# Patient Record
Sex: Female | Born: 1935 | Race: Black or African American | Hispanic: No | Smoking: Never smoker
Health system: Southern US, Community
[De-identification: ages and names within clinical notes are randomized; demographics above are authoritative.]

## PROBLEM LIST (undated history)

## (undated) DIAGNOSIS — I10 Essential (primary) hypertension: Secondary | ICD-10-CM

## (undated) DIAGNOSIS — C50919 Malignant neoplasm of unspecified site of unspecified female breast: Secondary | ICD-10-CM

## (undated) DIAGNOSIS — E119 Type 2 diabetes mellitus without complications: Secondary | ICD-10-CM

## (undated) HISTORY — PX: ABDOMINAL HYSTERECTOMY: SHX81

## (undated) HISTORY — PX: OTHER SURGICAL HISTORY: SHX169

## (undated) HISTORY — PX: BREAST SURGERY: SHX581

## (undated) HISTORY — PX: BLADDER SURGERY: SHX569

---

## 2012-09-22 ENCOUNTER — Emergency Department (HOSPITAL_BASED_OUTPATIENT_CLINIC_OR_DEPARTMENT_OTHER): Payer: Medicare Other

## 2012-09-22 ENCOUNTER — Encounter (HOSPITAL_BASED_OUTPATIENT_CLINIC_OR_DEPARTMENT_OTHER): Payer: Self-pay | Admitting: *Deleted

## 2012-09-22 ENCOUNTER — Emergency Department (HOSPITAL_BASED_OUTPATIENT_CLINIC_OR_DEPARTMENT_OTHER)
Admission: EM | Admit: 2012-09-22 | Discharge: 2012-09-23 | Disposition: A | Discharge status: Other Acute Inpatient Hospital | Payer: Medicare Other | Attending: Emergency Medicine | Admitting: Emergency Medicine

## 2012-09-22 DIAGNOSIS — R059 Cough, unspecified: Secondary | ICD-10-CM | POA: Insufficient documentation

## 2012-09-22 DIAGNOSIS — R0602 Shortness of breath: Secondary | ICD-10-CM | POA: Insufficient documentation

## 2012-09-22 DIAGNOSIS — M549 Dorsalgia, unspecified: Secondary | ICD-10-CM | POA: Insufficient documentation

## 2012-09-22 DIAGNOSIS — E119 Type 2 diabetes mellitus without complications: Secondary | ICD-10-CM | POA: Insufficient documentation

## 2012-09-22 DIAGNOSIS — I10 Essential (primary) hypertension: Secondary | ICD-10-CM | POA: Insufficient documentation

## 2012-09-22 DIAGNOSIS — R079 Chest pain, unspecified: Secondary | ICD-10-CM

## 2012-09-22 DIAGNOSIS — Z853 Personal history of malignant neoplasm of breast: Secondary | ICD-10-CM | POA: Insufficient documentation

## 2012-09-22 DIAGNOSIS — Z79899 Other long term (current) drug therapy: Secondary | ICD-10-CM | POA: Insufficient documentation

## 2012-09-22 DIAGNOSIS — R05 Cough: Secondary | ICD-10-CM | POA: Insufficient documentation

## 2012-09-22 HISTORY — DX: Malignant neoplasm of unspecified site of unspecified female breast: C50.919

## 2012-09-22 HISTORY — DX: Type 2 diabetes mellitus without complications: E11.9

## 2012-09-22 HISTORY — DX: Essential (primary) hypertension: I10

## 2012-09-22 LAB — COMPREHENSIVE METABOLIC PANEL
AST: 13 U/L (ref 0–37)
Albumin: 3.2 g/dL — ABNORMAL LOW (ref 3.5–5.2)
Calcium: 9.4 mg/dL (ref 8.4–10.5)
Creatinine, Ser: 1.2 mg/dL — ABNORMAL HIGH (ref 0.50–1.10)
GFR calc non Af Amer: 43 mL/min — ABNORMAL LOW (ref >90)

## 2012-09-22 LAB — CBC WITH DIFFERENTIAL/PLATELET
Basophils Absolute: 0.1 10*3/uL (ref 0.0–0.1)
Basophils Relative: 1 % (ref 0–1)
Eosinophils Absolute: 0.4 10*3/uL (ref 0.0–0.7)
Eosinophils Relative: 5 % (ref 0–5)
HCT: 36.1 % (ref 36.0–46.0)
MCHC: 32.7 g/dL (ref 30.0–36.0)
MCV: 87.2 fL (ref 78.0–100.0)
Monocytes Absolute: 0.8 10*3/uL (ref 0.1–1.0)
RDW: 15.6 % — ABNORMAL HIGH (ref 11.5–15.5)

## 2012-09-22 LAB — TROPONIN I: Troponin I: <0.3 ng/mL (ref <0.30)

## 2012-09-22 LAB — D-DIMER, QUANTITATIVE: D-Dimer, Quant: 1.55 ug/mL-FEU — ABNORMAL HIGH (ref 0.00–0.48)

## 2012-09-22 MED ORDER — IOHEXOL 350 MG/ML SOLN
100.0000 mL | Freq: Once | INTRAVENOUS | Status: AC | PRN
  Administered 2012-09-22: 100 mL via INTRAVENOUS

## 2012-09-22 NOTE — ED Provider Notes (Addendum)
History     CSN: [id number]  Arrival date & time 09/22/12  9562   First MD Initiated Contact with Patient 09/22/12 1938      Chief Complaint  Patient presents with  . Chest Pain    (Consider location/radiation/quality/duration/timing/severity/associated sxs/prior treatment) HPI Comments: Patient presents with intermittent back pain between her shoulder blades it has been happening on and off for the past week. It radiates to her central chest and is associated with shortness of breath. She denies any cough, nausea or vomiting. She was seen at [facility] 4 days ago for the same symptoms. She is unable to say what tests were done. She is scheduled for a stress test on February 15. She denies any history of cardiac problems. Her cough is productive of clear mucus. She denies any fevers, leg pain or swelling. She has a history of diabetes, breast cancer, hypertension.  The history is provided by the patient.    Past Medical History  Diagnosis Date  . Hypertension   . Diabetes mellitus without complication   . Breast cancer     Past Surgical History  Procedure Date  . Abdominal hysterectomy   . Breast surgery   . Bladder surgery   . Knee replacement     No family history on file.  History  Substance Use Topics  . Smoking status: Never Smoker   . Smokeless tobacco: Not on file  . Alcohol Use: No    OB History    Grav Para Term Preterm Abortions TAB SAB Ect Mult Living                  Review of Systems  Constitutional: Negative for fever, activity change and appetite change.  HENT: Negative for congestion and rhinorrhea.   Respiratory: Positive for chest tightness and shortness of breath. Negative for cough.   Cardiovascular: Positive for chest pain.  Gastrointestinal: Negative for nausea, vomiting and abdominal pain.  Genitourinary: Negative for dysuria, hematuria, vaginal bleeding and vaginal discharge.  Musculoskeletal: Positive for back pain.   Skin: Negative for rash.  A complete 10 system review of systems was obtained and all systems are negative except as noted in the HPI and PMH.    Allergies  Review of patient's allergies indicates no known allergies.  Home Medications   Current Outpatient Rx  Name  Route  Sig  Dispense  Refill  . AMLODIPINE BESYLATE 5 MG PO TABS   Oral   Take 5 mg by mouth daily.         Marland Kitchen GLIPIZIDE 10 MG PO TABS   Oral   Take 10 mg by mouth daily.         Marland Kitchen HYDROCHLOROTHIAZIDE 25 MG PO TABS   Oral   Take 25 mg by mouth daily.         Marland Kitchen RAMIPRIL 10 MG PO CAPS   Oral   Take 10 mg by mouth daily.           BP 147/77  Pulse 65  Temp 98.5 F (36.9 C) (Oral)  Resp 20  Ht 5\' 2"  (1.575 m)  Wt 245 lb (111.131 kg)  BMI 44.81 kg/m2  SpO2 99%  Physical Exam  Constitutional: She is oriented to person, place, and time. She appears well-developed and well-nourished. No distress.  HENT:  Head: Normocephalic and atraumatic.  Mouth/Throat: Oropharynx is clear and moist. No oropharyngeal exudate.  Eyes: Conjunctivae normal and EOM are normal. Pupils are equal, round, and  reactive to light.  Neck: Normal range of motion. Neck supple.  Cardiovascular: Normal rate, regular rhythm and normal heart sounds.   No murmur heard. Pulmonary/Chest: Effort normal and breath sounds normal. No respiratory distress.  Abdominal: Soft. There is no tenderness. There is no rebound and no guarding.  Musculoskeletal: She exhibits edema.       Left arm lymphedema Tender to palpation in the upper thoracic spine  Neurological: She is alert and oriented to person, place, and time. No cranial nerve deficit. She exhibits normal muscle tone. Coordination normal.       Equal grip strength bilaterally.  Skin: Skin is warm.    ED Course  Procedures (including critical care time)  Labs Reviewed  CBC WITH DIFFERENTIAL - Abnormal; Notable for the following:    Hemoglobin 11.8 (*)     RDW 15.6 (*)     All other  components within normal limits  COMPREHENSIVE METABOLIC PANEL - Abnormal; Notable for the following:    BUN 24 (*)     Creatinine, Ser 1.20 (*)     Total Protein 8.4 (*)     Albumin 3.2 (*)     Total Bilirubin 0.2 (*)     GFR calc non Af Amer 43 (*)     GFR calc Af Amer 50 (*)     All other components within normal limits  D-DIMER, QUANTITATIVE - Abnormal; Notable for the following:    D-Dimer, Quant 1.55 (*)     All other components within normal limits  TROPONIN I  TROPONIN I   Dg Chest 2 View  09/22/2012  *RADIOLOGY REPORT*  Clinical Data: Chest pain and shortness of breath.  CHEST - 2 VIEW  Comparison: None.  Findings: There is mild cardiomegaly without pulmonary edema. Lungs are clear.  No pneumothorax or pleural effusion.  The aorta is ectatic.  Degenerative change is present about the shoulders.  IMPRESSION: Cardiomegaly without acute disease.   Original Report Authenticated By: Holley Dexter, M.D.      No diagnosis found.    MDM  One week of intermittent mid back pain it radiates to the chest that is squeezing associated with shortness of breath. Pain free at this time. Recent admission to [facility] for the same.   EKG shows normal sinus rhythm. Troponin negative. Patient is pain-free at this time. Concern for anginal equivalent and patient with diabetes, hypertension, hyperlipidemia, obesity. Patient has not had any cardiac testing 15 years. She was hospitalized at [facility] 2 days ago with similar symptoms and discharged prior to stress test.  Records obtained from [facility] showed patient was admitted that were thoroughly with similar symptoms. She had a negative CT angiogram of her chest. She had negative serial troponins.  Given patient's risk factors is concerned that she has angina. Discussed with Dr. Selena Batten who agrees and will accept patient back to [place].   Date: 09/22/2012  Rate: 66  Rhythm: normal sinus rhythm  QRS Axis: normal  Intervals: PR prolonged  ST/T  Wave abnormalities: normal  Conduction Disutrbances:none  Narrative Interpretation:   Old EKG Reviewed: none available    Glynn Octave, MD 09/22/12 2331  Addendum: late entry.  After discussion with Dr. Selena Batten, it was found [facility] has no telemetry beds available.  Offered patient admission to [facility] which she refuses and wishes to wait for bed at [facility].  Glynn Octave, MD 09/23/12 1332

## 2012-09-22 NOTE — ED Notes (Signed)
Pt sts she has been sick x2 weeks and 2-3 days ago she began having an intermittent squeezing pain in her middle back radiating through to her center chest. Pt sts some SOB but no n/v.

## 2012-09-23 NOTE — ED Notes (Signed)
Report received from Scott Bennett, RN, care assumed.  

## 2012-09-23 NOTE — ED Notes (Signed)
Still awaiting bed at [facility].  Spoke with Doy Hutching, nursing supervisor, states he will call as soon as a bed is available.

## 2012-09-23 NOTE — ED Notes (Signed)
House supervisor at [facility] informed of pt in ED and stated they are waiting for discharges

## 2012-09-23 NOTE — ED Notes (Addendum)
Room 705 assigned at [facility], bed not ready.  [facility] will transport pt. Ptl updated with plan of care.

## 2012-09-23 NOTE — ED Notes (Signed)
Awaiting admission bed from [facility].  Pt informed of plan of care.

## 2014-11-06 IMAGING — CR DG CHEST 2V
2 series · 2 of 2 positions shown · non-contrast
Comparison: None.

CLINICAL DATA: Chest pain and shortness of breath.

CHEST - 2 VIEW

[w chest pa]
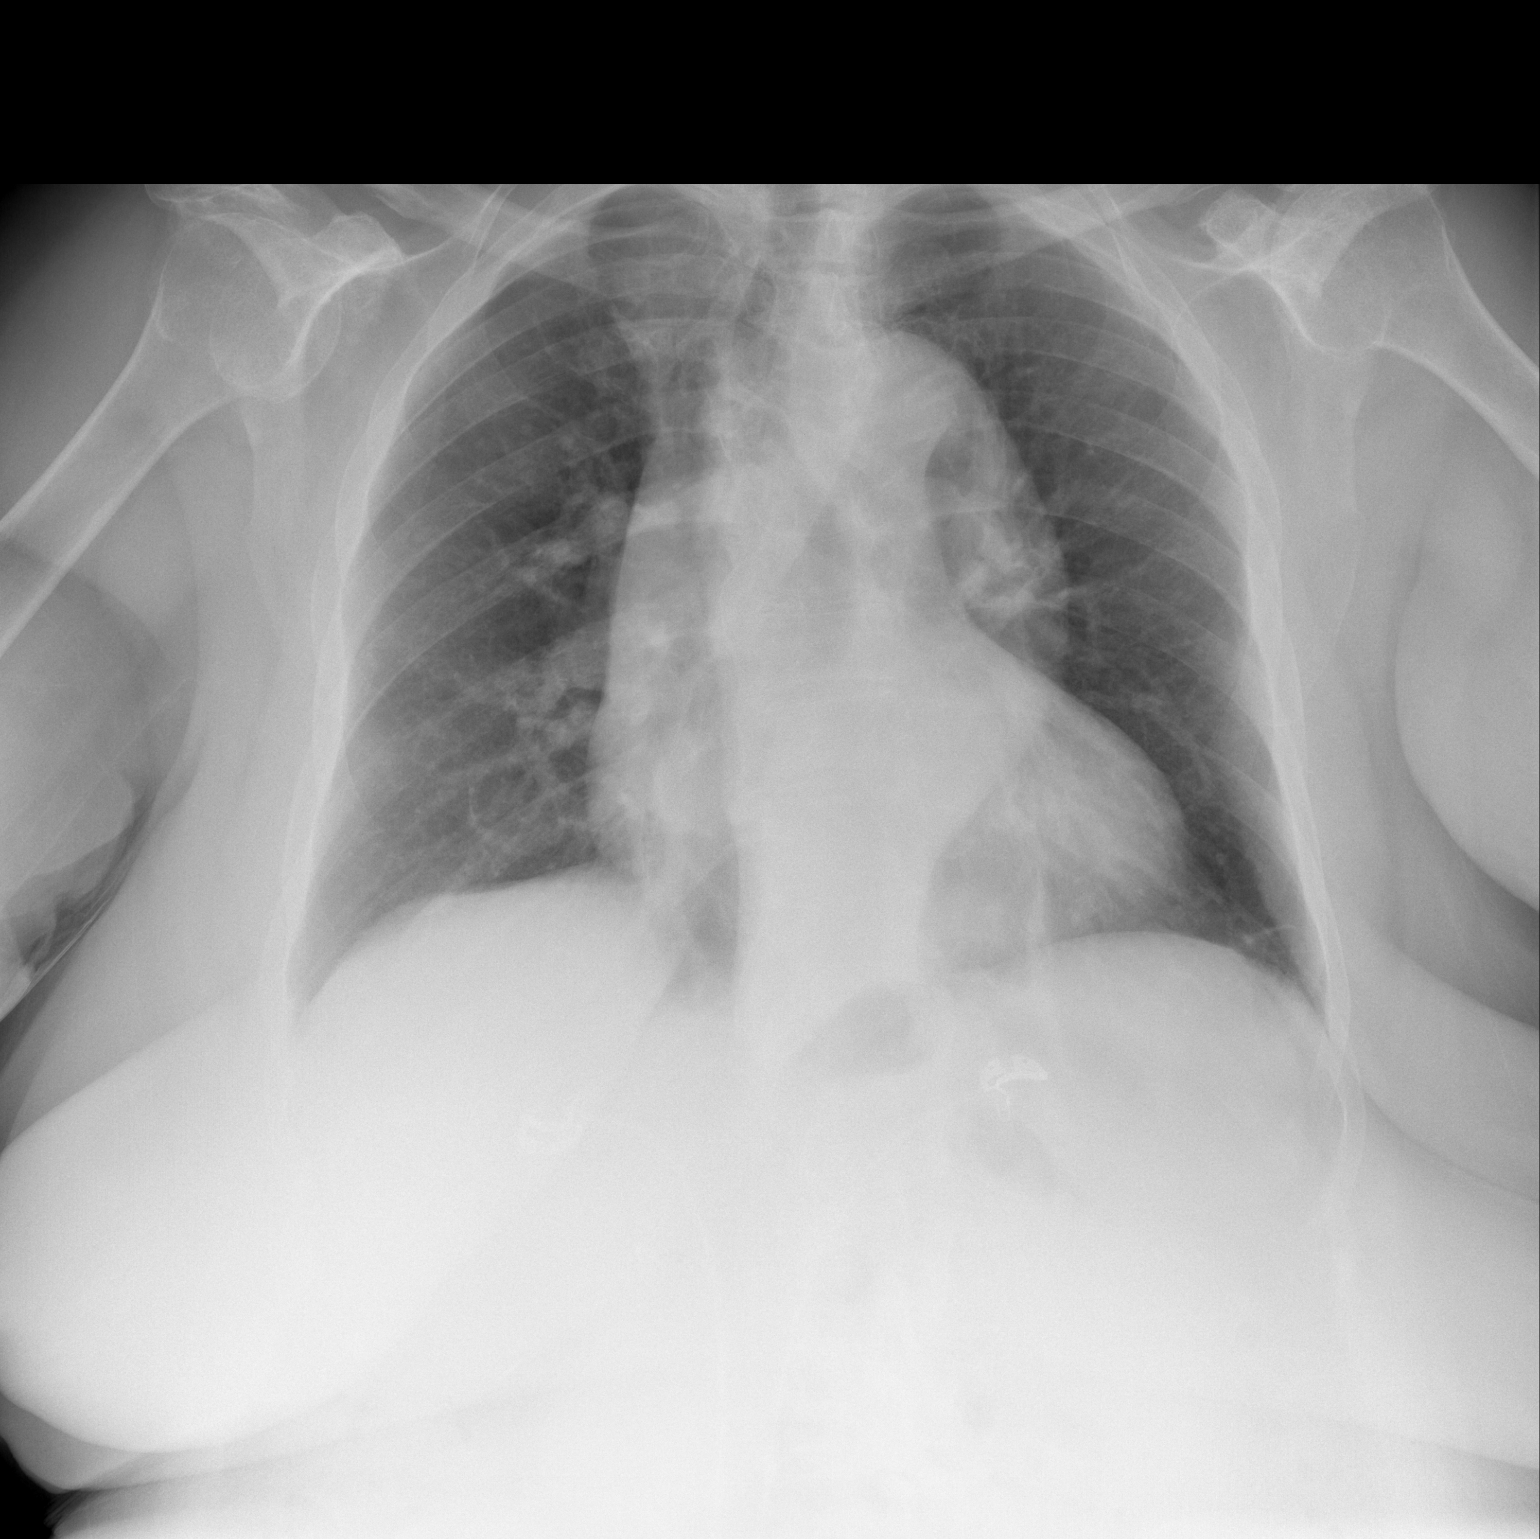

[w chest lat]
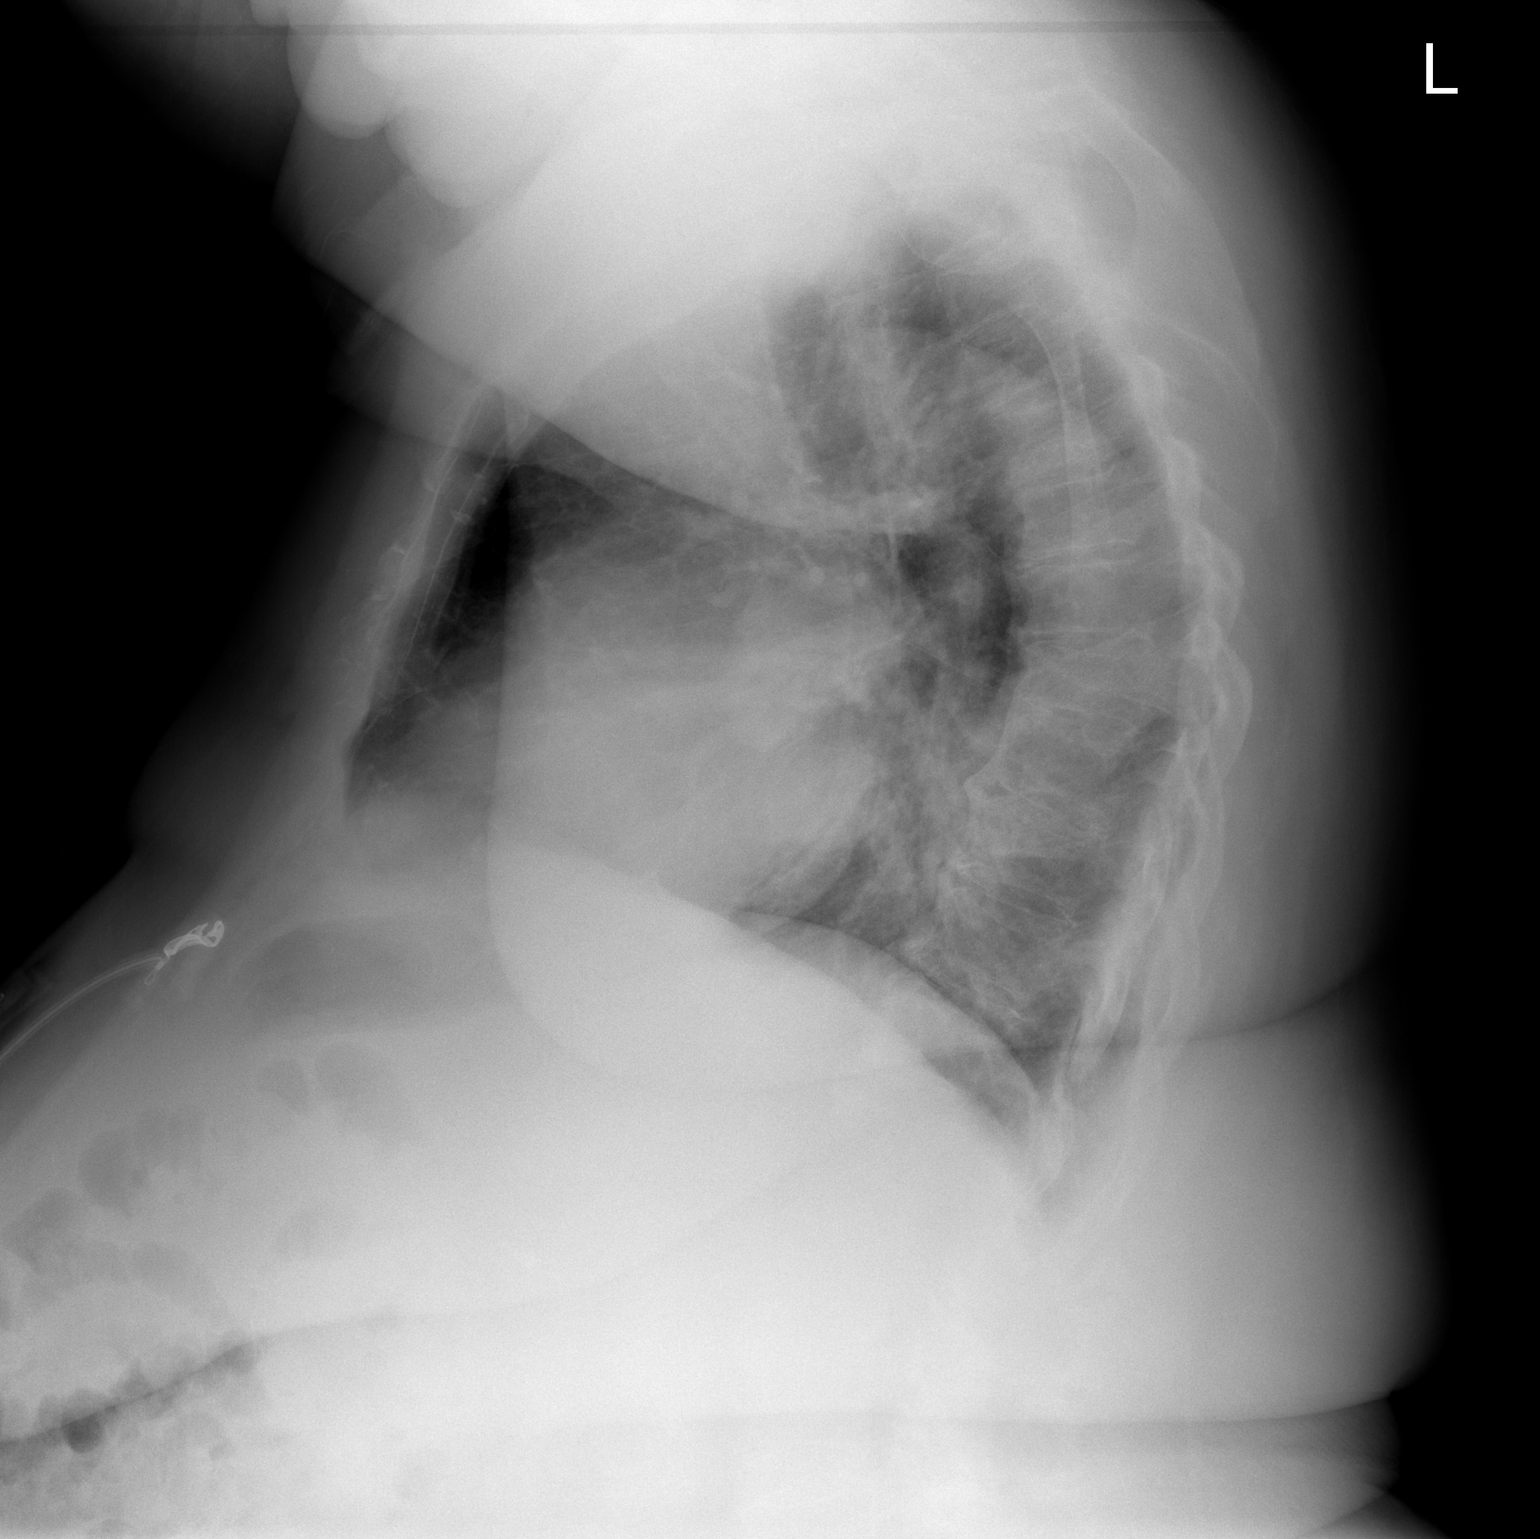

[2 of 2 positions shown; findings below may reference images not displayed]

FINDINGS: There is mild cardiomegaly without pulmonary edema.
Lungs are clear.  No pneumothorax or pleural effusion.  The aorta
is ectatic.  Degenerative change is present about the shoulders.
IMPRESSION: Cardiomegaly without acute disease.

## 2014-11-06 IMAGING — CT CT ANGIO CHEST
2 of 7 series · 17 of 46 positions shown · IV contrast (APPLIED)
Comparison: None.

CTA CHEST

CLINICAL DATA: MID CHEST PAIN AND BACK PAIN.  ELEVATED D-DIMER.
SHORTNESS BREATH.  DIABETIC HYPERTENSIVE PATIENT.  BREAST CANCER.
QUESTION DISSECTION

CT ANGIOGRAPHY CHEST, ABDOMEN AND PELVIS
TECHNIQUE: Multidetector CT imaging through the chest, abdomen and
pelvis was performed using the standard protocol during bolus
administration of intravenous contrast.  Multiplanar reconstructed
images including MIPs were obtained and reviewed to evaluate the
vascular anatomy.
Contrast: 100mL OMNIPAQUE IOHEXOL 350 MG/ML SOLN,

[Series 4: dissection 2.0 b26f · axial · 0.86mm/px · z∈[-418,+54]mm · 14 of 264 slices shown]
[im 14/264  lung]
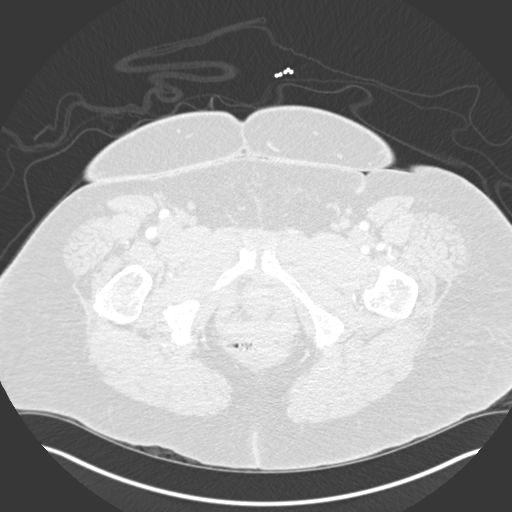
[im 28/264  soft-tissue]
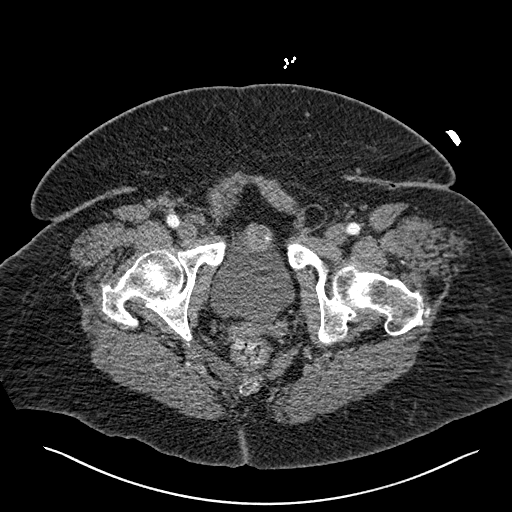
[im 56/264  lung]
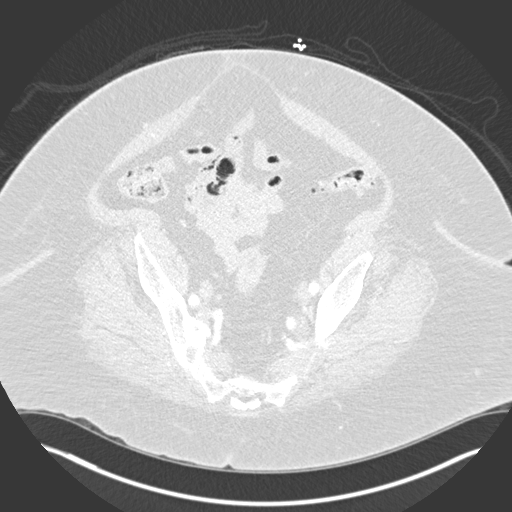
[im 70/264  soft-tissue]
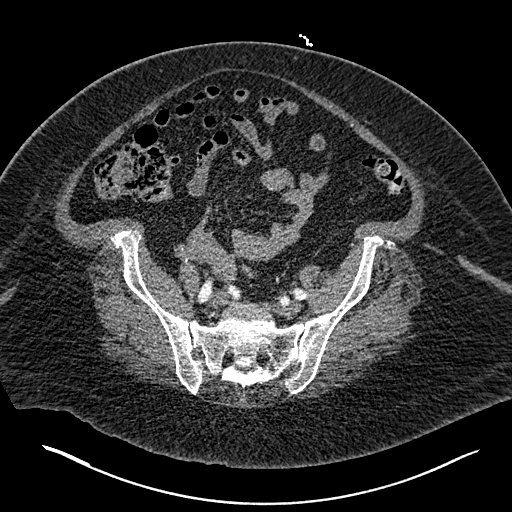
[im 84/264  lung]
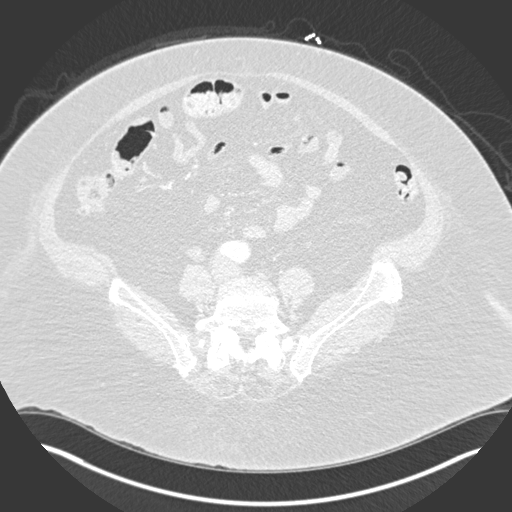
[im 111/264  soft-tissue]
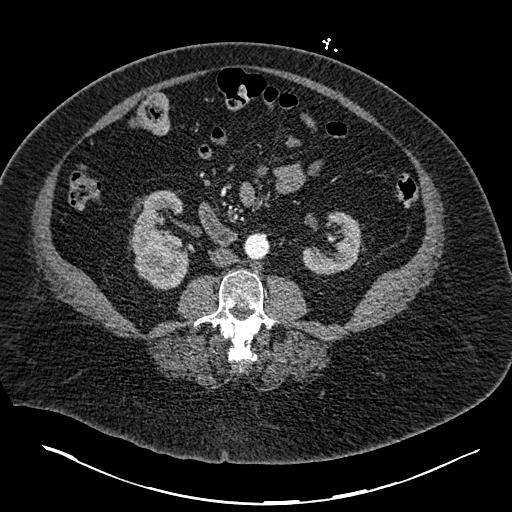
[im 125/264  lung]
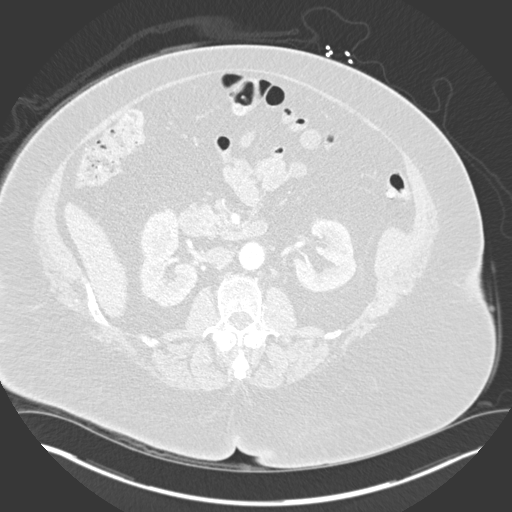
[im 139/264  soft-tissue]
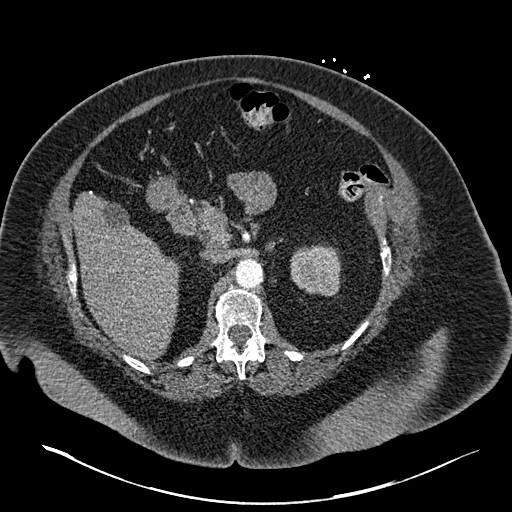
[im 153/264  lung]
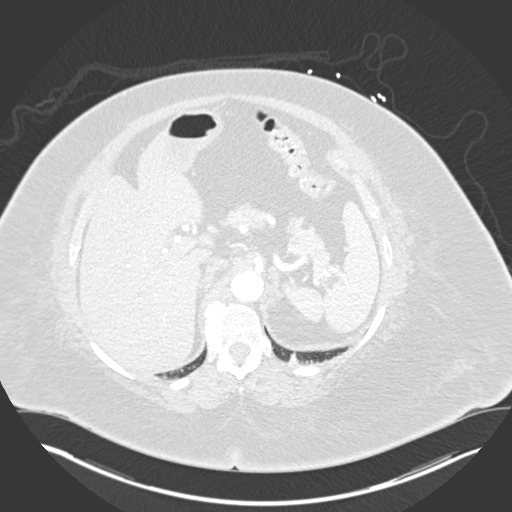
[im 180/264  soft-tissue]
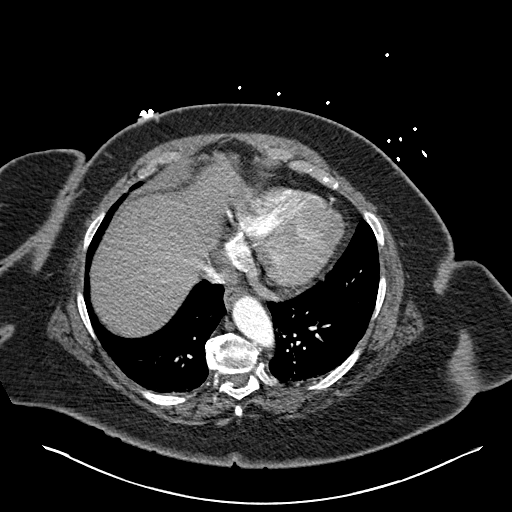
[im 194/264  lung]
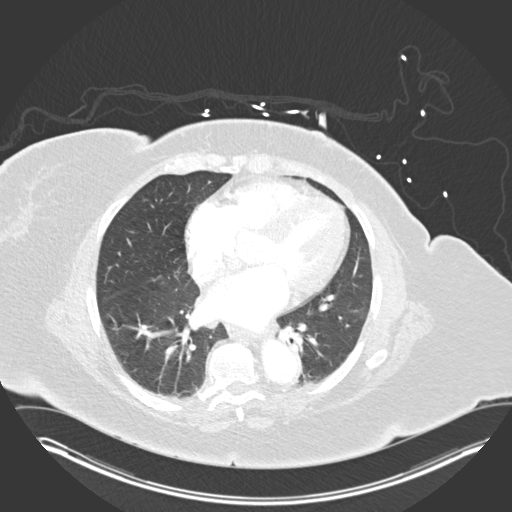
[im 208/264  soft-tissue]
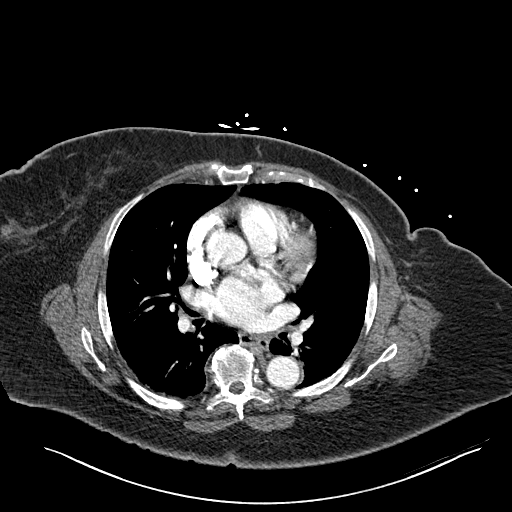
[im 236/264  lung]
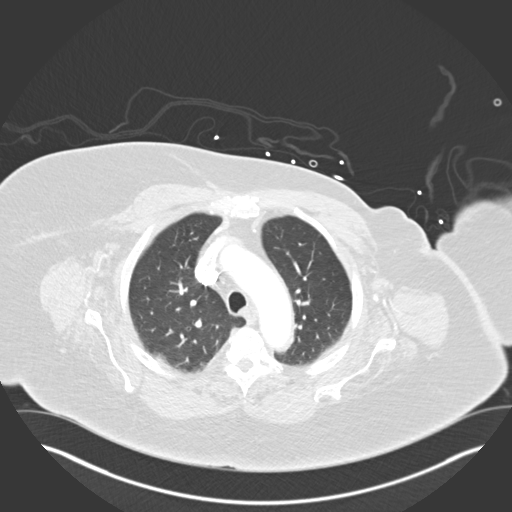
[im 250/264  soft-tissue]
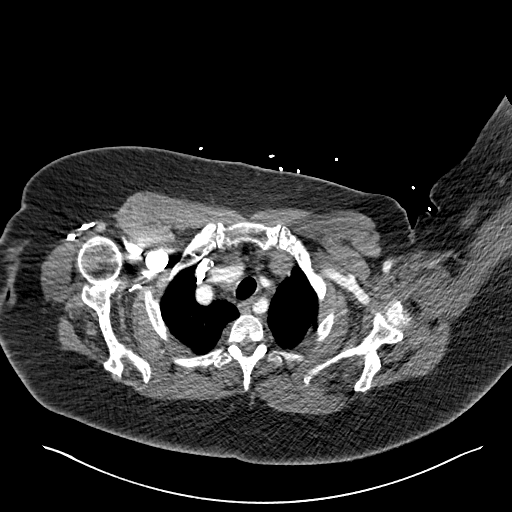

[Series 7: dissection 2.0 coronal · coronal · 1.06mm/px · 3 of 175 slices shown]
[im 44/175  soft-tissue]
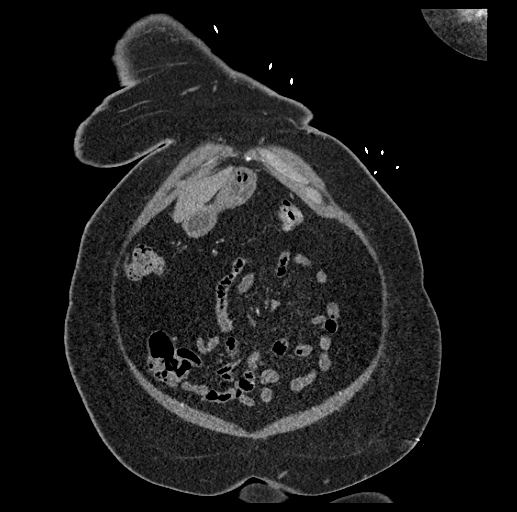
[im 88/175  soft-tissue]
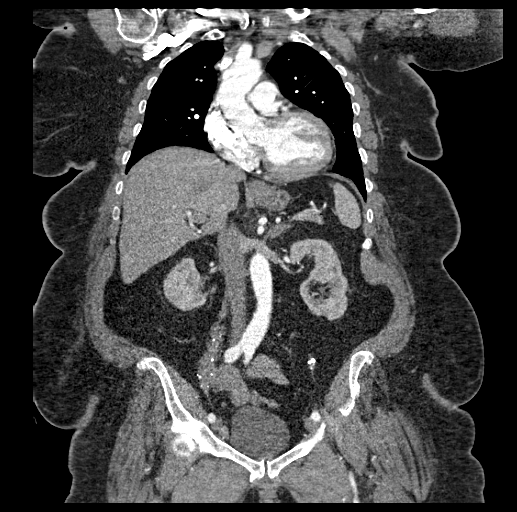
[im 131/175  soft-tissue]
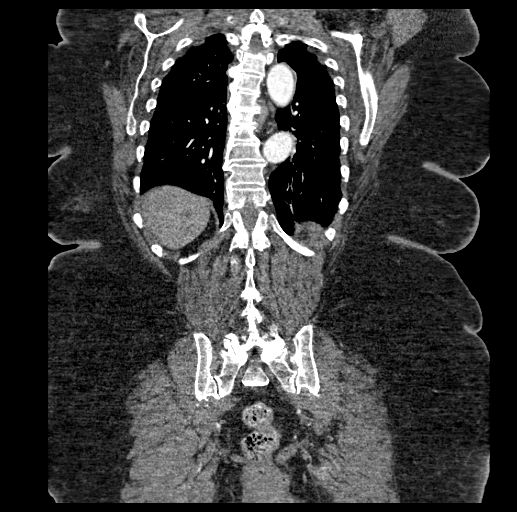

[17 of 46 positions shown; findings below may reference images not displayed]

FINDINGS: Mild cardiac motion slightly limits evaluation of the
ascending thoracic aorta.  No thoracic dissection noted.  Ectatic
thoracic aorta.

Goiter with substernal extension displaces great vessels.

The present examination was not performed as a pulmonary embolus
protocol.  No obvious pulmonary embolus noted.

Small hiatal hernia.

No mediastinal or hilar adenopathy.

Post left mastectomy.  Left lower axillary lymph nodes with maximal
short axis dimension of 9 mm.

Scattered subsegmental atelectasis/scarring without worrisome
pulmonary lesion.

Mild thoracic kyphosis and degenerative changes without worrisome
destructive lesion.  Mild ankylosis anterior aspect of mid to lower
thoracic vertebra.

 Review of the MIP images confirms the above findings.
IMPRESSION: No evidence of aortic dissection.  Please see above.

CTA ABDOMEN AND PELVIS
FINDINGS: No evidence of abdominal aortic aneurysm or dissection.
Mild atherosclerotic type changes of the abdominal aorta with
ectasia.

Mild narrowing origin of the right vertebral artery.  Mild
narrowing proximal subclavian artery.  Mild narrowing origin of the
inferior mesenteric artery.  Calcification and mild ectasia of the
common iliac arteries.  No high-grade stenosis.

Arterial phase imaging without worrisome hepatic, splenic,
pancreatic, renal or adrenal lesion.  There is mild hyperplasia of
the left adrenal gland.  Post cholecystectomy with 2 cm cyst
inferior aspect of the right lobe of liver.  Anterior inferior
right renal cyst.

No extraluminal bowel inflammatory process, free fluid or free air.
Scattered diverticula noted.  No inflammation surrounds the
appendix.

Stomach is under distended evaluation slightly limited.

Noncontrast filled views of the urinary bladder unremarkable.

Facet joint degenerative changes with anterior slip of L4 and less
so L5 with spinal stenosis multifactorial in origin and most
prominent at the L4-5 level.

Left hip joint degenerative changes with subchondral cyst femoral
head.  Prominent bilateral sacroiliac joint degenerative changes
greater on the right.

]Adenopathy most notable right external iliac region measuring
x 1.2 cm.

Nonspecific bilateral calcified structures greater on the right.
Possible this is related to the ovaries.  Post hysterectomy.

 Review of the MIP images confirms the above findings.
IMPRESSION: [No evidence of abdominal aortic aneurysm or dissection.  Mild
atherosclerotic type changes of the abdominal aorta with ectasia.

Mild narrowing origin of the right vertebral artery.  Mild
narrowing proximal subclavian artery.  Mild narrowing origin of the
inferior mesenteric artery.  Calcification and mild ectasia of the
common iliac arteries.  No high-grade stenosis.

Facet joint degenerative changes with anterior slip of L4 and less
so L5 with spinal stenosis multifactorial in origin and most
prominent at the L4-5 level.

Left hip joint degenerative changes with subchondral cyst femoral
head.  Prominent bilateral sacroiliac joint degenerative changes
greater on the right.

Adenopathy most notable right external iliac region measuring 2.9 x
1.2 cm.

## 2015-03-03 ENCOUNTER — Encounter (HOSPITAL_BASED_OUTPATIENT_CLINIC_OR_DEPARTMENT_OTHER): Payer: Self-pay | Admitting: *Deleted

## 2015-03-03 ENCOUNTER — Emergency Department (HOSPITAL_BASED_OUTPATIENT_CLINIC_OR_DEPARTMENT_OTHER)
Admission: EM | Admit: 2015-03-03 | Discharge: 2015-03-03 | Disposition: A | Discharge status: Home / Self Care | Payer: Medicare HMO | Attending: Emergency Medicine | Admitting: Emergency Medicine

## 2015-03-03 DIAGNOSIS — K0889 Other specified disorders of teeth and supporting structures: Secondary | ICD-10-CM

## 2015-03-03 DIAGNOSIS — Z79899 Other long term (current) drug therapy: Secondary | ICD-10-CM | POA: Insufficient documentation

## 2015-03-03 DIAGNOSIS — I10 Essential (primary) hypertension: Secondary | ICD-10-CM | POA: Insufficient documentation

## 2015-03-03 DIAGNOSIS — Z853 Personal history of malignant neoplasm of breast: Secondary | ICD-10-CM | POA: Insufficient documentation

## 2015-03-03 DIAGNOSIS — K088 Other specified disorders of teeth and supporting structures: Secondary | ICD-10-CM | POA: Insufficient documentation

## 2015-03-03 DIAGNOSIS — E119 Type 2 diabetes mellitus without complications: Secondary | ICD-10-CM | POA: Insufficient documentation

## 2015-03-03 DIAGNOSIS — Z8739 Personal history of other diseases of the musculoskeletal system and connective tissue: Secondary | ICD-10-CM | POA: Diagnosis not present

## 2015-03-03 LAB — CBG MONITORING, ED: Glucose-Capillary: 75 mg/dL (ref 65–99)

## 2015-03-03 MED ORDER — PENICILLIN V POTASSIUM 500 MG PO TABS: 500.0000 mg | ORAL_TABLET | Freq: Four times a day (QID) | ORAL | Status: AC

## 2015-03-03 MED ORDER — HYDROCODONE-ACETAMINOPHEN 5-325 MG PO TABS
1.0000 | ORAL_TABLET | Freq: Once | ORAL | Status: AC
  Administered 2015-03-03: 1 via ORAL
  Filled 2015-03-03: qty 1

## 2015-03-03 MED ORDER — PENICILLIN V POTASSIUM 250 MG PO TABS
500.0000 mg | ORAL_TABLET | Freq: Four times a day (QID) | ORAL | Status: DC
  Administered 2015-03-03: 500 mg via ORAL
  Filled 2015-03-03: qty 2

## 2015-03-03 MED ORDER — HYDROCODONE-ACETAMINOPHEN 5-325 MG PO TABS: 2.0000 | ORAL_TABLET | ORAL | Status: AC | PRN

## 2015-03-03 NOTE — ED Notes (Signed)
Pt reports dentalpain x 1 week, worse yesterday- hypertensive in triage- states she is on meds for her B/p

## 2015-03-03 NOTE — ED Notes (Signed)
I helped patient to restroom, Patient stated her BP may be high as result of her son's recent death.

## 2015-03-03 NOTE — ED Notes (Signed)
Pt alert, NAD, calm, interactive, resps e/u, speaking in clear complete sentences, no dyspnea noted, up to b/r, steady gait with cane, out in w/c by EMT.

## 2015-03-03 NOTE — ED Provider Notes (Signed)
CSN: [id number]     Arrival date & time 03/03/15  1850 History  This chart was scribed for Orlie Dakin, MD by Chester Holstein, ED Scribe. This patient was seen in room MH07/MH07 and the patient's care was started at 8:37 PM.     Chief Complaint  Patient presents with  . Dental Pain     Patient is a 80 y.o. female presenting with tooth pain. The history is provided by the patient. No language interpreter was used.  Dental Pain Location:  Lower Severity:  Severe Duration:  1 week Timing:  Constant Progression:  Worsening Ineffective treatments:  Acetaminophen Associated symptoms: no facial swelling and no fever   Risk factors: diabetes and lack of dental care   Risk factors: no alcohol problem and no smoking    HPI Comments: Julia Howell is a 80 y.o. female with PMHx of HTN, DM, arthritis, and breast CA who presents to the Emergency Department complaining of right lower dental pain with onset 1 week ago, worsening yesterday. She does not have a dentist. Pt took Tylenol around 4 PM with some relief. No facial swelling noted. Pt last took her CBG yesterday but cannot recall the number. She reports her sugars have been under control. She is compliant with her BP medication. She reports recent stressors.  Pt is not a smoker and denies EtOH use. She has NKDA. She denies fever and feeling of malaise.  She denies SOB. Denies chest pain denies other associated symptoms. No fever.  Past Medical History  Diagnosis Date  . Hypertension   . Diabetes mellitus without complication   . Breast cancer    Past Surgical History  Procedure Laterality Date  . Abdominal hysterectomy    . Breast surgery    . Bladder surgery    . Knee replacement     No family history on file. History  Substance Use Topics  . Smoking status: Never Smoker   . Smokeless tobacco: Never Used  . Alcohol Use: No   OB History    No data available     Review of Systems  Constitutional: Negative.  Negative for fever.   HENT: Positive for dental problem. Negative for facial swelling.   Respiratory: Negative.  Negative for shortness of breath.   Cardiovascular: Negative.   Gastrointestinal: Negative.   Musculoskeletal: Negative.   Skin: Negative.   Allergic/Immunologic: Positive for immunocompromised state.       Diabetic  Neurological: Negative.   Psychiatric/Behavioral: Negative.   All other systems reviewed and are negative.     Allergies  Review of patient's allergies indicates no known allergies.  Home Medications   Prior to Admission medications   Medication Sig Start Date End Date Taking? Authorizing Provider  amLODipine (NORVASC) 5 MG tablet Take 5 mg by mouth daily.   Yes Historical Provider, MD  glipiZIDE (GLUCOTROL) 10 MG tablet Take 10 mg by mouth daily.   Yes Historical Provider, MD  hydrochlorothiazide (HYDRODIURIL) 25 MG tablet Take 25 mg by mouth daily.   Yes Historical Provider, MD  ramipril (ALTACE) 10 MG capsule Take 10 mg by mouth daily.   Yes Historical Provider, MD   BP 202/122 mmHg  Pulse 63  Temp(Src) 100.6 F (38.1 C) (Oral)  Resp 20  Ht 5\' 2"  (1.575 m)  Wt 245 lb (111.131 kg)  BMI 44.80 kg/m2  SpO2 99% Physical Exam  Constitutional: She appears well-developed and well-nourished. No distress.  HENT:  Head: Normocephalic and atraumatic.  Right Ear: External ear  normal.  Left Ear: External ear normal.  Poor dentition generally. Tooth #28 is exquisitely tender. No fluctuance of swelling of gingiva no trismus. No tenderness or fluctuance of submandibular area  Eyes: Conjunctivae are normal. Pupils are equal, round, and reactive to light.  Neck: Neck supple. No tracheal deviation present. No thyromegaly present.  Cardiovascular: Normal rate and regular rhythm.   No murmur heard. Pulmonary/Chest: Effort normal and breath sounds normal.  Abdominal: Soft. Bowel sounds are normal. She exhibits no distension. There is no tenderness.  Obese  Musculoskeletal: Normal  range of motion. She exhibits no edema or tenderness.  Neurological: She is alert. Coordination normal.  Skin: Skin is warm and dry. No rash noted.  Psychiatric: She has a normal mood and affect.  Nursing note and vitals reviewed.   ED Course  Procedures (including critical care time) DIAGNOSTIC STUDIES: Oxygen Saturation is 99% on room air, normal by my interpretation.    COORDINATION OF CARE: 8:42 PM Discussed treatment plan with patient at beside, the patient agrees with the plan and has no further questions at this time.   Labs Review Labs Reviewed  CBC WITH DIFFERENTIAL/PLATELET  COMPREHENSIVE METABOLIC PANEL  I-STAT CG4 LACTIC ACID, ED    Imaging Review No results found.   EKG Interpretation None     ED ECG REPORT   Date: 03/03/2015  Rate: 60  Rhythm: normal sinus rhythm  QRS Axis: left  Intervals: normal  ST/T Wave abnormalities: nonspecific T wave changes  Conduction Disutrbances:none  Narrative Interpretation:   Old EKG Reviewed: unchanged No significant change from 10/02/2012 I have personally reviewed the EKG tracing and agree with the computerized printout as noted. Results for orders placed or performed during the hospital encounter of 03/03/15  CBG monitoring, ED  Result Value Ref Range   Glucose-Capillary 75 65 - 99 mg/dL   No results found.  MDM  Patient is not ill-appearing and reports to me that she does not feel ill mildly febrile on arrival.. I feel that she can have close dental follow-up with prescription for penicillin, Norco. Suspect. Periapical abscess Final diagnoses:  None   also suggested recheck of blood pressure within 1 week by PMD Diagnosis #1 dental pain #2 hypertension       Orlie Dakin, MD 03/03/15 2136

## 2015-03-03 NOTE — Discharge Instructions (Signed)
Dental Pain Take Tylenol for mild pain or the pain medicine prescribed for bad pain. Don't take Tylenol together with the pain medicine prescribed as the combination can be dangerous. Call any of the numbers on the resource guide tomorrow to see a dentist this week. You can also find a dentist in the phone book. Your blood pressure was elevated tonight at 202/122. Blood sugar was 75, which is normal. Ask your doctor to recheck your blood pressure in one week A tooth ache may be caused by cavities (tooth decay). Cavities expose the nerve of the tooth to air and hot or cold temperatures. It may come from an infection or abscess (also called a boil or furuncle) around your tooth. It is also often caused by dental caries (tooth decay). This causes the pain you are having. DIAGNOSIS  Your caregiver can diagnose this problem by exam. TREATMENT   If caused by an infection, it may be treated with medications which kill germs (antibiotics) and pain medications as prescribed by your caregiver. Take medications as directed.  Only take over-the-counter or prescription medicines for pain, discomfort, or fever as directed by your caregiver.  Whether the tooth ache today is caused by infection or dental disease, you should see your dentist as soon as possible for further care. SEEK MEDICAL CARE IF: The exam and treatment you received today has been provided on an emergency basis only. This is not a substitute for complete medical or dental care. If your problem worsens or new problems (symptoms) appear, and you are unable to meet with your dentist, call or return to this location. SEEK IMMEDIATE MEDICAL CARE IF:   You have a fever.  You develop redness and swelling of your face, jaw, or neck.  You are unable to open your mouth.  You have severe pain uncontrolled by pain medicine. MAKE SURE YOU:   Understand these instructions.  Will watch your condition.  Will get help right away if you are not doing  well or get worse. Document Released: 08/03/2005 Document Revised: 10/26/2011 Document Reviewed: 03/21/2008 [facility] Patient Information 2015 [place], [place]. This information is not intended to replace advice given to you by your health care provider. Make sure you discuss any questions you have with your health care provider.  Emergency Department Resource Guide 1) Find a Doctor and Pay Out of Pocket Although you won't have to find out who is covered by your insurance plan, it is a good idea to ask around and get recommendations. You will then need to call the office and see if the doctor you have chosen will accept you as a new patient and what types of options they offer for patients who are self-pay. Some doctors offer discounts or will set up payment plans for their patients who do not have insurance, but you will need to ask so you aren't surprised when you get to your appointment.  2) Contact Your Local Health Department Not all health departments have doctors that can see patients for sick visits, but many do, so it is worth a call to see if yours does. If you don't know where your local health department is, you can check in your phone book. The CDC also has a tool to help you locate your state's health department, and many state websites also have listings of all of their local health departments.  3) Find a [facility] If your illness is not likely to be very severe or complicated, you may want to try a  walk in clinic. These are popping up all over the country in pharmacies, drugstores, and shopping centers. They're usually staffed by nurse practitioners or physician assistants that have been trained to treat common illnesses and complaints. They're usually fairly quick and inexpensive. However, if you have serious medical issues or chronic medical problems, these are probably not your best option.  No Primary Care Doctor: - Call Health Connect at  [phone] - they can help you locate a  primary care doctor that  accepts your insurance, provides certain services, etc. - Physician Referral Service- [phone]  Chronic Pain Problems: Organization         Address  Phone   Notes  [facility]  [phone] Patients need to be referred by their primary care doctor.   Medication Assistance: Organization         Address  Phone   Notes  [facility]ication [facility] [place] [place], [place] [address] [phone] --Must be a resident of [facility] -- Must have NO insurance coverage whatsoever (no Medicaid/ Medicare, etc.) -- The pt. MUST have a primary care doctor that directs their care regularly and follows them in the community   MedAssist  [phone]   [employer]  [phone]    Agencies that provide inexpensive medical care: Organization         Address  Phone   Notes  [place]  [phone]   [facility]    [phone]   [facility] [place], [place] [address] [phone]   [place] [address] [place] [phone]   Planned Parenthood    [phone]   [facility]    [phone]   [place] and [place] [address], [place] Phone:  [phone], Fax:  [phone] Hours of Operation:  9 am - 6 pm, M-F.  Also accepts Medicaid/Medicare and self-pay.  [facility] for [place] [address], [address], [place] Phone: [phone], Fax: [phone]. Hours of Operation:  8:30 am - 5:30 pm, M-F.  Also accepts Medicaid and self-pay.  [facility] [place] [address], [employer] Phone: [phone]   [place], [place], [place] [phone], Ext. [phone] Mondays & Thursdays: 7-9 AM.  First 15 patients are seen on a first come, first serve basis.    [place]  Providers:  Organization         Address  Phone   Notes  [facility] [address], [place] [phone] Also accepts self-pay patients.  [facility] [address], [place]  [phone]   [place], [address], [place] [phone]   [facility] [address], [place] [phone]   Lucianne Lei [address], [place]   [phone] Only accepts [place] Access [place] patients after they have their name applied to their card.   Self-Pay (no insurance) in [facility]:  Organization         Address  Phone   Notes  Sickle Cell Patients, [facility] [place] [phone]   [facility] [place] [phone]   Gershon Mussel  [facility] Deputy  [place], [address], [place] [phone]   [facility]/Dr. Osei-Bonsu  [address] [place] or [address], [place] [phone] Phone number for both [place] and [place] locations is the same.  Urgent Medical [facility] [address] [place] [phone]   [facility] [place] [address], [place] or [address] [phone]   [facility] [address], [place] [phone], phone; [phone], fax Sees patients 1st and 3rd Saturday of every month.  Must not qualify for public or private insurance (i.e. Medicaid, Medicare, [place] Health Choice, Veterans' Benefits)  Household income should be no more than 200% of the poverty level The clinic cannot treat you if you are pregnant or think you are pregnant  Sexually transmitted diseases are not treated at the clinic.    Dental Care: Organization         Address  Phone  Notes  [facility] Department of [facility] [place] [phone] Accepts children up to age 9 who are enrolled in [place] or [place]; pregnant women with a Medicaid card; and children who have applied for Medicaid or [place] Health Choice, but were declined, whose parents can pay a reduced fee at time of service.  [facility]  [address], [place] [phone] Accepts children up to age 3 who are enrolled in [place] or [place]; pregnant women with a Medicaid card; and children who have applied for Medicaid or [place] Health Choice, but were declined, whose parents can pay a reduced fee at time of service.  [place] Adult [facility] Access PROGRAM  [place] [phone] Patients are seen by appointment only. Walk-ins are not accepted. [place] will see patients 59 years of age and older. Monday - Tuesday (8am-5pm) Most Wednesdays (8:30-5pm) $30 per visit, cash only  [facility] Adult Dental Access PROGRAM  [address], [facility] [phone] Patients are seen by appointment only. Walk-ins are not accepted. [place] will see patients 86 years of age and older. One Wednesday Evening (Monthly: Volunteer Based).  $30 per visit, cash only  [place]  [phone] for adults; Children under age 20, call Graduate Pediatric Dentistry at [phone]. Children aged 15-14, please call [phone] to request a pediatric application.  Dental services are provided in all areas of dental care including fillings, crowns and bridges, complete and partial dentures, implants, gum treatment, root canals, and extractions. Preventive care is also provided. Treatment is provided to both adults and children. Patients are selected via a lottery and there is often a waiting list.   [place] [address] [place]  [phone] [email or web address]   [facility]  [address] [place], [place] [phone], Ext. [phone] Second and Fourth Thursday of each month, opens at 6:30 AM; Clinic ends at 9 AM.  Patients are seen on a first-come first-served basis, and a limited number are seen during each clinic.   [facility]  [address] [place], [place] [phone]   Eligibility Requirements You must have lived in [place], [place], or [place] counties for at least the last three months.   You cannot be eligible for state or federal sponsored healthcare  insurance, including [employer], [place], or [employer].   You generally cannot be eligible for healthcare insurance through your employer.    How to apply: Eligibility screenings are held every Tuesday and Wednesday afternoon from 1:00 pm until 4:00 pm. You do not need an appointment for the interview!  [facility] [address], [place], [place]   [place]  [place] Department  [place]  [phone]    Behavioral Health Resources in the Community: Intensive Outpatient Programs Organization         Address  Phone  Notes  [place]. [address] [place], [place] [phone]   [facility] [address] [place], [place]   ADS: Alcohol & Drug Svcs [address] [place], [place]   [place] [address],  [place], [place] or [phone]   Substance Abuse Resources Organization         Address  Phone  Notes  Alcohol and Drug Services  [phone]   [place]  [phone]   [facility] [place]   Chinita Pester  [phone]   Residential & Outpatient Substance Abuse Program  [phone]   Psychological Services Organization         Address  Phone  Notes  [facility] [place]  [place]  [phone]   [place] [address] [place] or [phone]    Mobile Crisis Teams Organization         Address  Phone  Notes  [facility], Mobile Crisis Care Unit  [phone]   [facility]  [address] [place], [place]   [place] [address], [place] [phone]    Self-Help/Support Groups Organization         Address  Phone             Notes  [place]. of [place] - variety of support groups  Ages Call for more information  Narcotics Anonymous (NA), [facility] [address], [employer] [place]  2 meetings at this location   Special educational needs teacher         Address  Phone  Notes  ASAP Residential Treatment [place],    [place]  [phone]   [facility]  [address] [place] [address]11657, [place], [place]   [place], [place] [phone] Admissions: 8am-3pm M-F  Incentives Substance [place] [address]    [place], [place] [phone]   [facility] [address] [place], [place], [place]   [facility] [address],  [place], [place]   Insight Programs - Intensive Outpatient [address], [place], [place]   [facility] ([place] [address].,  [place], [place] [phone] or [phone]   [facility]idential Treatment Services (RTS) [address] [place], [place] Accepts Medicaid  [facility] [place] [address]  [place] [phone] Substance Abuse/Addiction Treatment   [facility] Organization         Address  Phone  Notes  [facility]  [phone]   Domenic Schwab, PhD [address] [place], [place]   [phone] or [phone])  [place]   [place], [place] [phone]   [facility] Recovery [address], [place], [place] [phone] Insurance/Medicaid/sponsorship through [facility] and Families [address] [place], [place] [phone] [place], [place] [phone]    Dr. Adele Schilder  [phone]   [facility] Dept. 1) [address] [place] 2) [place] 3)  [place] [address], [place] [phone]  [phone]   [place] [phone] or [phone] (After Hours)
